# Patient Record
Sex: Male | Born: 1990 | Race: White | Hispanic: No | Marital: Married | Smoking: Former smoker
Health system: Southern US, Community
[De-identification: ages and names within clinical notes are randomized; demographics above are authoritative.]

## PROBLEM LIST (undated history)

## (undated) HISTORY — PX: OTHER SURGICAL HISTORY: SHX169

---
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
## 2015-03-26 ENCOUNTER — Encounter: Payer: Self-pay | Admitting: Emergency Medicine

## 2015-03-26 ENCOUNTER — Ambulatory Visit
Admission: EM | Admit: 2015-03-26 | Discharge: 2015-03-26 | Disposition: A | Discharge status: Home / Self Care | Payer: BC Managed Care – PPO | Attending: Family Medicine | Admitting: Family Medicine

## 2015-03-26 DIAGNOSIS — L247 Irritant contact dermatitis due to plants, except food: Secondary | ICD-10-CM

## 2015-03-26 DIAGNOSIS — R21 Rash and other nonspecific skin eruption: Secondary | ICD-10-CM | POA: Diagnosis not present

## 2015-03-26 MED ORDER — PREDNISONE 10 MG (21) PO TBPK: ORAL_TABLET | ORAL | Status: DC

## 2015-03-26 MED ORDER — RANITIDINE HCL 150 MG PO CAPS: 150.0000 mg | ORAL_CAPSULE | Freq: Two times a day (BID) | ORAL | Status: DC

## 2015-03-26 MED ORDER — METHYLPREDNISOLONE SODIUM SUCC 125 MG IJ SOLR
125.0000 mg | Freq: Once | INTRAMUSCULAR | Status: AC
  Administered 2015-03-26: 125 mg via INTRAMUSCULAR

## 2015-03-26 MED ORDER — LORATADINE 10 MG PO TABS: 10.0000 mg | ORAL_TABLET | Freq: Every day | ORAL | Status: DC

## 2015-03-26 NOTE — ED Provider Notes (Signed)
CSN: [id number]     Arrival date & time 03/26/15  1246 History   First MD Initiated Contact with Patient 03/26/15 1333     Chief Complaint  Patient presents with  . Rash   he states he was exposed to poison ivy or poison sumac on Tuesday feels miserable now. (Consider location/radiation/quality/duration/timing/severity/associated sxs/prior Treatment) Patient is a 24 y.o. male presenting with rash. The history is provided by the patient. No language interpreter was used.  Rash Location:  Shoulder/arm Shoulder/arm rash location:  L upper arm, L arm, R arm, R upper arm, L forearm, R forearm, R wrist and L wrist Quality: blistering, draining, itchiness and swelling   Quality: not dry, not painful and not peeling   Severity:  Moderate Onset quality:  Sudden Duration:  3 days Timing:  Rare Progression:  Worsening Chronicity:  New Context: exposure to similar rash   Relieved by:  Nothing Worsened by:  Continued exposure to allergens Ineffective treatments:  Anti-fungal cream Associated symptoms: no nausea      History reviewed. No pertinent past medical history. Past Surgical History  Procedure Laterality Date  . Arm surgery Right    History reviewed. No pertinent family history. Social History  Substance Use Topics  . Smoking status: Former Games developer  . Smokeless tobacco: None  . Alcohol Use: Yes    patient does not smoke. Was a former smoker nurse's notes were reviewed. Review of Systems  Gastrointestinal: Negative for nausea.  Skin: Positive for rash.  All other systems reviewed and are negative.   Allergies  Review of patient's allergies indicates no known allergies.  Home Medications   Prior to Admission medications   Medication Sig Start Date End Date Taking? Authorizing Siegfried Vieth  loratadine (CLARITIN) 10 MG tablet Take 1 tablet (10 mg total) by mouth daily. For itching 03/26/15   Hassan Rowan, MD  predniSONE (STERAPRED UNI-PAK 21 TAB) 10 MG (21) TBPK tablet 6 tabs  day 1 and 2, 5 tabs day 3 and 4, 4 tabs day 5 and 6, 3 tabs day 7 and 8, 2 tabs day 9 and 10, 1 tab day 11 and 12. Take orally 03/26/15   Hassan Rowan, MD  ranitidine (ZANTAC) 150 MG capsule Take 1 capsule (150 mg total) by mouth 2 (two) times daily. 03/26/15   Hassan Rowan, MD   Meds Ordered and Administered this Visit   Medications  methylPREDNISolone sodium succinate (SOLU-MEDROL) 125 mg/2 mL injection 125 mg (125 mg Intramuscular Given 03/26/15 1356)    BP 106/70 mmHg  Pulse 60  Temp(Src) 96.8 F (36 C) (Tympanic)  Resp 16  Ht  (1.88 m)  Wt 140 lb (63.504 kg)  BMI 17.97 kg/m2  SpO2 100% No data found.   Physical Exam  Constitutional: He is oriented to person, place, and time. He appears well-developed and well-nourished.  HENT:  Head: Normocephalic and atraumatic.  Eyes: Conjunctivae are normal. Pupils are equal, round, and reactive to light.  Musculoskeletal: Normal range of motion.  Neurological: He is alert and oriented to person, place, and time.  Skin: Rash noted. There is erythema.     Significant rash on the arms and hands fingers and some of the neck and lower faceconsistent with a contact dermatitis rash.  Psychiatric: He has a normal mood and affect. His behavior is normal.    ED Course  Procedures (including critical care time)  Labs Review Labs Reviewed - No data to display  Imaging Review No results found.   Visual  Acuity Review  Right Eye Distance:   Left Eye Distance:   Bilateral Distance:    Right Eye Near:   Left Eye Near:    Bilateral Near:         MDM   1. Contact dermatitis and eczema due to plant   2. Rash       Patient insight Medrol taper 125 mg IM will be. Will be placed on Claritin 10 mg daily Zantac 150 twice a day for the itching. Place on 12 day prednisone Dosepak. Patient declined work note for today.     Hassan Rowan, MD 03/26/15 [id number]

## 2015-03-26 NOTE — Discharge Instructions (Signed)
Poison [employer] ivy is a rash caused by touching the leaves of the poison ivy plant. The rash often shows up 48 hours later. You might just have bumps, redness, and itching. Sometimes, blisters appear and break open. Your eyes may get puffy (swollen). Poison ivy often heals in 2 to 3 weeks without treatment. HOME CARE  If you touch poison ivy:  Wash your skin with soap and water right away. Wash under your fingernails. Do not rub the skin very hard.  Wash any clothes you were wearing.  Avoid poison ivy in the future. Poison ivy has 3 leaves on a stem.  Use medicine to help with itching as told by your doctor. Do not drive when you take this medicine.  Keep open sores dry, clean, and covered with a bandage and medicated cream, if needed.  Ask your doctor about medicine for children. GET HELP RIGHT AWAY IF:  You have open sores.  Redness spreads beyond the area of the rash.  There is yellowish white fluid (pus) coming from the rash.  Pain gets worse.  You have a temperature by mouth above 102 F (38.9 C), not controlled by medicine. MAKE SURE YOU:  Understand these instructions.  Will watch your condition.  Will get help right away if you are not doing well or get worse. Document Released: 07/16/2010 Document Revised: 09/05/2011 Document Reviewed: 07/16/2010 [facility] Patient Information 2015 [place], [place]. This information is not intended to replace advice given to you by your health care provider. Make sure you discuss any questions you have with your health care provider.  Contact Dermatitis Contact dermatitis is a rash that happens when something touches the skin. You touched something that irritates your skin, or you have allergies to something you touched. HOME CARE   Avoid the thing that caused your rash.  Keep your rash away from hot water, soap, sunlight, chemicals, and other things that might bother it.  Do not scratch your rash.  You can take cool baths to  help stop itching.  Only take medicine as told by your doctor.  Keep all doctor visits as told. GET HELP RIGHT AWAY IF:   Your rash is not better after 3 days.  Your rash gets worse.  Your rash is puffy (swollen), tender, red, sore, or warm.  You have problems with your medicine. MAKE SURE YOU:   Understand these instructions.  Will watch your condition.  Will get help right away if you are not doing well or get worse. Document Released: 04/10/2009 Document Revised: 09/05/2011 Document Reviewed: 11/16/2010 [facility] Patient Information 2015 [place], [place]. This information is not intended to replace advice given to you by your health care provider. Make sure you discuss any questions you have with your health care provider.  Rash A rash is a change in the color or feel of your skin. There are many different types of rashes. You may have other problems along with your rash. HOME CARE  Avoid the thing that caused your rash.  Do not scratch your rash.  You may take cools baths to help stop itching.  Only take medicines as told by your doctor.  Keep all doctor visits as told. GET HELP RIGHT AWAY IF:   Your pain, puffiness (swelling), or redness gets worse.  You have a fever.  You have new or severe problems.  You have body aches, watery poop (diarrhea), or you throw up (vomit).  Your rash is not better after 3 days. MAKE SURE YOU:  Understand these instructions.  Will watch your condition.  Will get help right away if you are not doing well or get worse. Document Released: 11/30/2007 Document Revised: 09/05/2011 Document Reviewed: 03/28/2011 [employer] Patient Information 2015 [place], [place]. This information is not intended to replace advice given to you by your health care provider. Make sure you discuss any questions you have with your health care provider.

## 2015-03-26 NOTE — ED Notes (Signed)
Patient c/o itchy red rash on his arms and face for the past 2 days.

## 2017-05-16 ENCOUNTER — Encounter: Payer: Self-pay | Admitting: Emergency Medicine

## 2017-05-16 ENCOUNTER — Emergency Department: Payer: Worker's Compensation

## 2017-05-16 ENCOUNTER — Emergency Department
Admission: EM | Admit: 2017-05-16 | Discharge: 2017-05-16 | Disposition: A | Discharge status: Home / Self Care | Payer: Worker's Compensation | Attending: Emergency Medicine | Admitting: Emergency Medicine

## 2017-05-16 DIAGNOSIS — W231XXA Caught, crushed, jammed, or pinched between stationary objects, initial encounter: Secondary | ICD-10-CM | POA: Diagnosis not present

## 2017-05-16 DIAGNOSIS — Y9389 Activity, other specified: Secondary | ICD-10-CM | POA: Diagnosis not present

## 2017-05-16 DIAGNOSIS — S6991XA Unspecified injury of right wrist, hand and finger(s), initial encounter: Secondary | ICD-10-CM | POA: Diagnosis present

## 2017-05-16 DIAGNOSIS — Y929 Unspecified place or not applicable: Secondary | ICD-10-CM | POA: Diagnosis not present

## 2017-05-16 DIAGNOSIS — Y990 Civilian activity done for income or pay: Secondary | ICD-10-CM | POA: Insufficient documentation

## 2017-05-16 DIAGNOSIS — Z79899 Other long term (current) drug therapy: Secondary | ICD-10-CM | POA: Diagnosis not present

## 2017-05-16 DIAGNOSIS — S60211A Contusion of right wrist, initial encounter: Secondary | ICD-10-CM | POA: Diagnosis not present

## 2017-05-16 DIAGNOSIS — Z87891 Personal history of nicotine dependence: Secondary | ICD-10-CM | POA: Insufficient documentation

## 2017-05-16 DIAGNOSIS — S60811A Abrasion of right wrist, initial encounter: Secondary | ICD-10-CM | POA: Diagnosis not present

## 2017-05-16 MED ORDER — BACITRACIN ZINC 500 UNIT/GM EX OINT
TOPICAL_OINTMENT | Freq: Once | CUTANEOUS | Status: AC
  Administered 2017-05-16: 1 via TOPICAL
  Filled 2017-05-16: qty 0.9

## 2017-05-16 MED ORDER — IBUPROFEN 800 MG PO TABS
800.0000 mg | ORAL_TABLET | Freq: Once | ORAL | Status: AC
  Administered 2017-05-16: 800 mg via ORAL
  Filled 2017-05-16: qty 1
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
## 2017-05-16 NOTE — ED Notes (Signed)
Per Supervisor, Marshall & IlsleyHerman Maxey, no urine drug screen required.AS
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
## 2017-05-16 NOTE — Discharge Instructions (Signed)
Your x-rays are negative for fracture. Keep the wounds clean, dry, and covered. Wear the wrist splint as needed for support. Apply ice to reduce pain and swelling. Follow-up with ortho for continued symptoms.
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
## 2018-09-28 IMAGING — CR DG WRIST COMPLETE 3+V*R*
4 series · 4 of 4 positions shown · non-contrast
Comparison: None.

CLINICAL DATA: Heavy object fell on wrist with pain, initial
encounter

EXAM:
RIGHT WRIST - COMPLETE 3+ VIEW

[wrist pa]
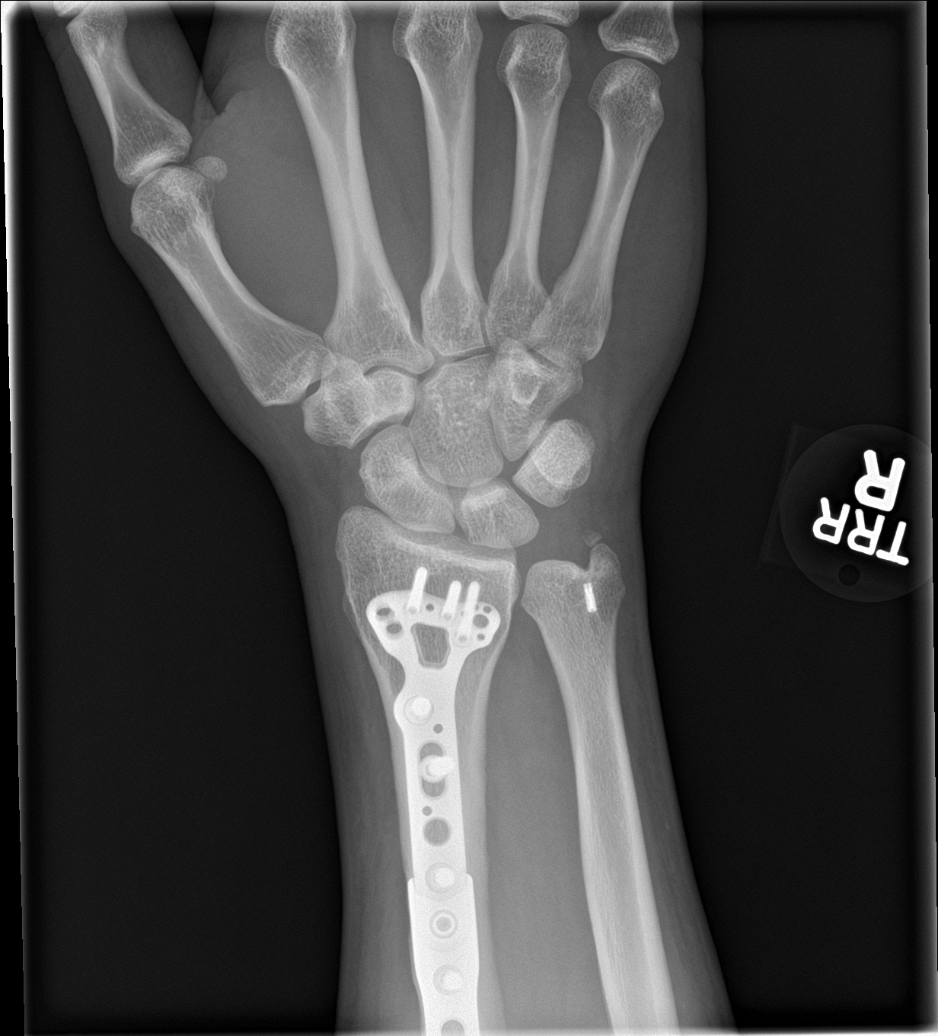

[wrist obl]
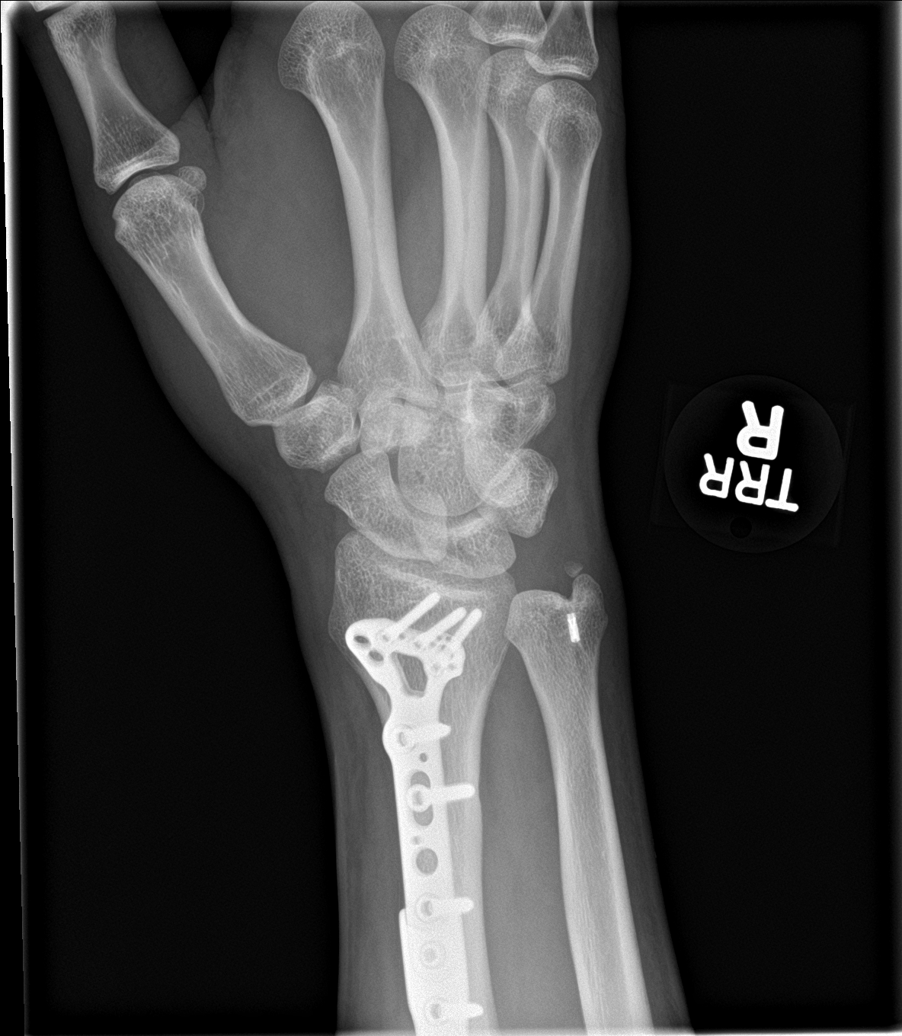

[wrist lat]
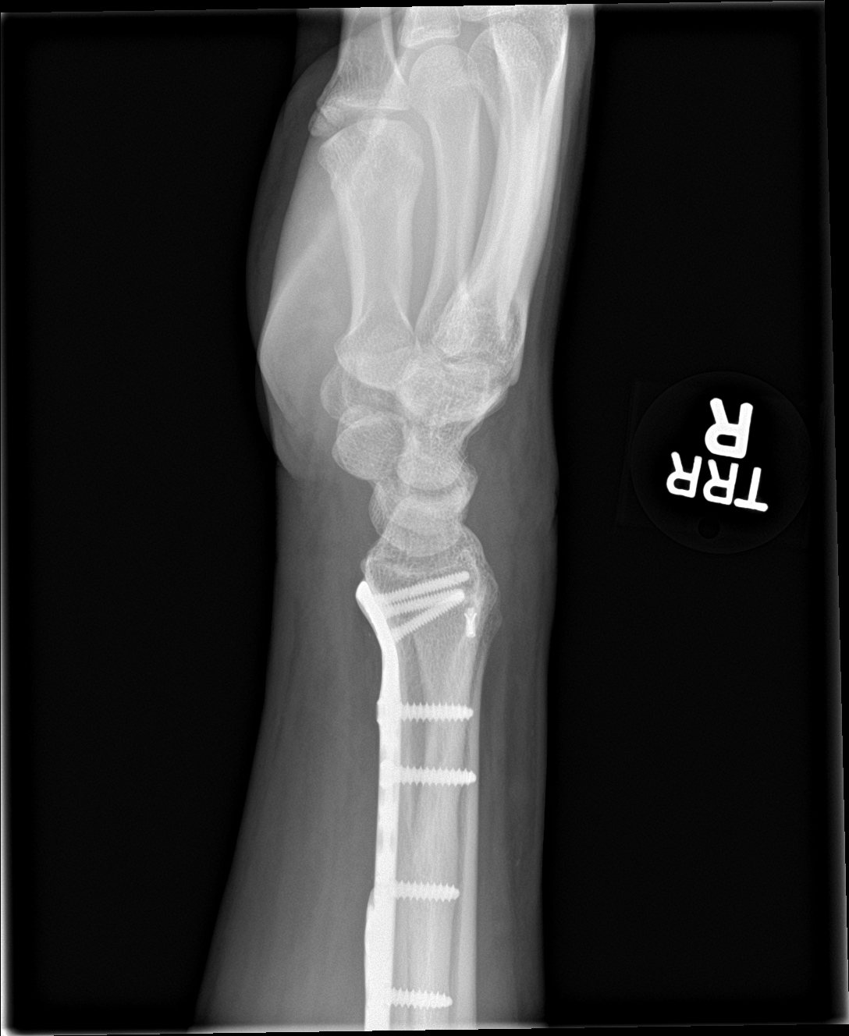

[navicular]
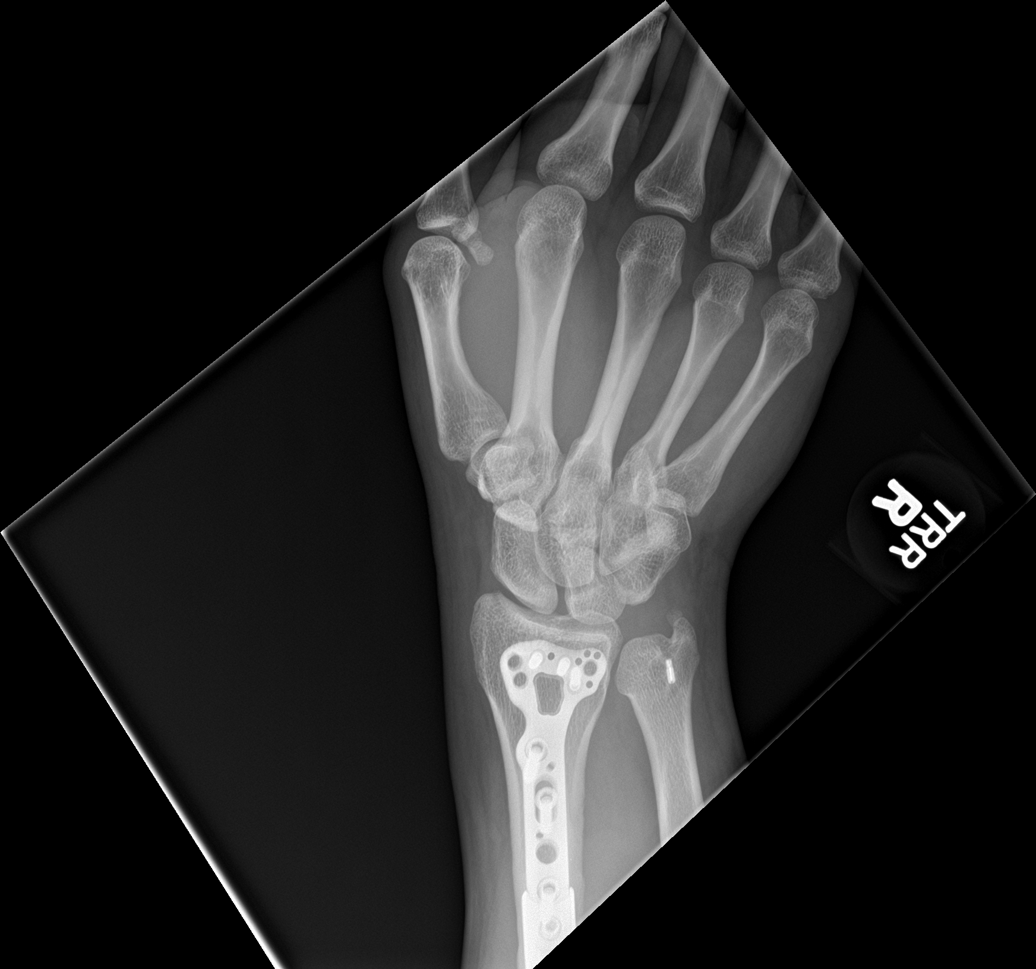

[4 of 4 positions shown; findings below may reference images not displayed]

FINDINGS: Postsurgical changes are noted in the distal radius and distal ulna.
Well corticated ulnar styloid fracture fragment is noted related to
prior trauma. No acute fracture or dislocation is noted. No soft
tissue changes are seen.
IMPRESSION: Postsurgical and posttraumatic changes without acute abnormality.

## 2020-01-08 ENCOUNTER — Encounter: Payer: Self-pay | Admitting: Emergency Medicine

## 2020-01-08 ENCOUNTER — Other Ambulatory Visit: Payer: Self-pay

## 2020-01-08 ENCOUNTER — Ambulatory Visit
Admission: EM | Admit: 2020-01-08 | Discharge: 2020-01-08 | Disposition: A | Discharge status: Home / Self Care | Payer: PRIVATE HEALTH INSURANCE | Attending: Family Medicine | Admitting: Family Medicine

## 2020-01-08 DIAGNOSIS — Z87891 Personal history of nicotine dependence: Secondary | ICD-10-CM | POA: Insufficient documentation

## 2020-01-08 DIAGNOSIS — Z20822 Contact with and (suspected) exposure to covid-19: Secondary | ICD-10-CM | POA: Insufficient documentation

## 2020-01-08 DIAGNOSIS — J069 Acute upper respiratory infection, unspecified: Secondary | ICD-10-CM | POA: Diagnosis present

## 2020-01-08 MED ORDER — HYDROCOD POLST-CPM POLST ER 10-8 MG/5ML PO SUER: 5.0000 mL | Freq: Every evening | ORAL | 0 refills | Status: DC | PRN

## 2020-01-08 NOTE — Discharge Instructions (Signed)
Take medication as prescribed. Rest. Drink plenty of fluids.  ° °Follow up with your primary care physician this week as needed. Return to Urgent care for new or worsening concerns.  ° °

## 2020-01-08 NOTE — ED Provider Notes (Signed)
[facility] ____________________________________________  Time seen: Approximately 6:40 PM  I have reviewed the triage vital signs and the nursing notes.   HISTORY  Chief Complaint Cough and Nasal Congestion   HPI Donald Sharp is a 29 y.o. male present for evaluation of redness, nasal ingestion and coughing.  Reports this past Saturday he started with runny nose and nasal congestion sinus pressure, and reports cough started more yesterday.  States cough is disrupting sleep.  States now feel sore from coughing.  Denies fevers.  Occasional sore throat from coughing alone, denies other sore throat.  Denies changes in taste or smell, vomiting, diarrhea.  Continues eat and drink well.  Denies known sick contacts.  Not previously vaccinated for COVID-19.  Did take over-the-counter cough congestion medication with some improvement, no resolution.  Reports otherwise doing well.   History reviewed. No pertinent past medical history.  There are no problems to display for this patient.   Past Surgical History:  Procedure Laterality Date   arm surgery Right      No current facility-administered medications for this encounter.  Current Outpatient Medications:    chlorpheniramine-HYDROcodone (TUSSIONEX PENNKINETIC ER) 10-8 MG/5ML SUER, Take 5 mLs by mouth at bedtime as needed for cough. do not drive or operate machinery while taking as can cause drowsiness., Disp: 50 mL, Rfl: 0  Allergies Patient has no known allergies.  History reviewed. No pertinent family history.  Social History Social History   Tobacco Use   Smoking status: Former Smoker   Smokeless tobacco: Never Used  Substance Use Topics   Alcohol use: Yes   Drug use: Not on file    Review of Systems Constitutional: No fever ENT: As above.  Cardiovascular: Denies chest pain. Respiratory: Denies shortness of breath. Gastrointestinal: No abdominal pain.  No nausea, no vomiting.  No diarrhea.     Musculoskeletal: Negative for back pain. Skin: Negative for rash.   ____________________________________________   PHYSICAL EXAM:  VITAL SIGNS: ED Triage Vitals  Enc Vitals Group     BP 01/08/20 1715 (!) 166/68     Pulse Rate 01/08/20 1715 91     Resp 01/08/20 1715 18     Temp 01/08/20 1715 98.6 F (37 C)     Temp Source 01/08/20 1715 Oral     SpO2 01/08/20 1715 98 %     Weight 01/08/20 1713 152 lb (68.9 kg)     Height 01/08/20 1713 6\' 1"  (1.854 m)     Head Circumference --      Peak Flow --      Pain Score 01/08/20 1713 0     Pain Loc --      Pain Edu? --      Excl. in GC? --     Constitutional: Alert and oriented. Well appearing and in no acute distress. Eyes: Conjunctivae are normal.  Head: Atraumatic. No sinus tenderness to palpation. No swelling. No erythema.  Ears: no erythema, normal TMs bilaterally.   Nose:Nasal congestion   Mouth/Throat: Mucous membranes are moist. No pharyngeal erythema. No tonsillar swelling or exudate.  Neck: No stridor.  No cervical spine tenderness to palpation. Hematological/Lymphatic/Immunilogical: No cervical lymphadenopathy. Cardiovascular: Normal rate, regular rhythm. Grossly normal heart sounds.  Good peripheral circulation. Respiratory: Normal respiratory effort.  No retractions. No wheezes, rales or rhonchi. Good air movement.  Musculoskeletal: Ambulatory with steady gait. Neurologic:  Normal speech and language. No gait instability. Skin:  Skin appears warm, dry and intact. No rash noted. Psychiatric: Mood and affect  are normal. Speech and behavior are normal.  ___________________________________________   LABS (all labs ordered are listed, but only abnormal results are displayed)  Labs Reviewed  SARS CORONAVIRUS 2 (TAT 6-24 HRS)   ____________________________________________  PROCEDURES Procedures    INITIAL IMPRESSION / ASSESSMENT AND PLAN / ED COURSE  Pertinent labs & imaging results that were available during my  care of the patient were reviewed by me and considered in my medical decision making (see chart for details).  Well-appearing patient.  No acute distress.  Suspect viral upper respiratory infection.  COVID-19 testing ordered.  Encourage rest, fluids, supportive care, over-the-counter cough congestion medication during the day and as needed Tussionex at night.  Work note given.Discussed indication, risks and benefits of medications with patient.   Discussed follow up with Primary care physician this week. Discussed follow up and return parameters including no resolution or any worsening concerns. Patient verbalized understanding and agreed to plan.   ____________________________________________   FINAL CLINICAL IMPRESSION(S) / ED DIAGNOSES  Final diagnoses:  Viral URI with cough     ED Discharge Orders         Ordered    chlorpheniramine-HYDROcodone (TUSSIONEX PENNKINETIC ER) 10-8 MG/5ML SUER  At bedtime PRN     Discontinue  Reprint     01/08/20 1736           Note: This dictation was prepared with Dragon dictation along with smaller phrase technology. Any transcriptional errors that result from this process are unintentional.         Renford Dills, NP 01/08/20 1843

## 2020-01-08 NOTE — ED Triage Notes (Signed)
Patient c/o sinus congestion and drainage that started on Saturday. He states he started having a cough that started yesterday. Denies fever.

## 2020-01-09 LAB — SARS CORONAVIRUS 2 (TAT 6-24 HRS): SARS Coronavirus 2: NEGATIVE

## 2021-07-09 ENCOUNTER — Encounter: Payer: Self-pay | Admitting: Emergency Medicine

## 2021-07-09 ENCOUNTER — Ambulatory Visit
Admission: EM | Admit: 2021-07-09 | Discharge: 2021-07-09 | Disposition: A | Discharge status: Home / Self Care | Payer: BC Managed Care – PPO | Attending: Emergency Medicine | Admitting: Emergency Medicine

## 2021-07-09 ENCOUNTER — Other Ambulatory Visit: Payer: Self-pay

## 2021-07-09 DIAGNOSIS — J0100 Acute maxillary sinusitis, unspecified: Secondary | ICD-10-CM

## 2021-07-09 MED ORDER — AMOXICILLIN-POT CLAVULANATE 875-125 MG PO TABS: 1.0000 | ORAL_TABLET | Freq: Two times a day (BID) | ORAL | 0 refills | Status: AC

## 2021-07-09 MED ORDER — IPRATROPIUM BROMIDE 0.06 % NA SOLN: 2.0000 | Freq: Four times a day (QID) | NASAL | 12 refills | Status: DC

## 2021-07-09 MED ORDER — PROMETHAZINE-DM 6.25-15 MG/5ML PO SYRP: 5.0000 mL | ORAL_SOLUTION | Freq: Four times a day (QID) | ORAL | 0 refills | Status: DC | PRN

## 2021-07-09 NOTE — ED Provider Notes (Signed)
With thick yellow to green nasal discharge.  He is also experiencing postnasal drip [facility]    CSN: [id number] Arrival date & time: 07/09/21  1153      History   Chief Complaint Chief Complaint  Patient presents with   Sinus Problem   Cough    HPI Donald Sharp is a 31 y.o. male.   HPI  31 year old male here for evaluation of respiratory complaints.  Patient reports that he has been experiencing sinus congestion and pressure for the last 2 weeks that is associated with a thick yellow nasal discharge and postnasal drip.  1 week ago he started to develop a cough which is mostly present at night but occasionally throughout the day.  Patient denies any fever, ear pain, sore throat, shortness breath, or wheezing.  History reviewed. No pertinent past medical history.  There are no problems to display for this patient.   Past Surgical History:  Procedure Laterality Date   arm surgery Right        Home Medications    Prior to Admission medications   Medication Sig Start Date End Date Taking? Authorizing Provider  amoxicillin-clavulanate (AUGMENTIN) 875-125 MG tablet Take 1 tablet by mouth every 12 (twelve) hours for 10 days. 07/09/21 07/19/21 Yes Margarette Canada, NP  ipratropium (ATROVENT) 0.06 % nasal spray Place 2 sprays into both nostrils 4 (four) times daily. 07/09/21  Yes Margarette Canada, NP  promethazine-dextromethorphan (PROMETHAZINE-DM) 6.25-15 MG/5ML syrup Take 5 mLs by mouth 4 (four) times daily as needed. 07/09/21  Yes Margarette Canada, NP  loratadine (CLARITIN) 10 MG tablet Take 1 tablet (10 mg total) by mouth daily. For itching 03/26/15 01/08/20  Frederich Cha, MD  ranitidine (ZANTAC) 150 MG capsule Take 1 capsule (150 mg total) by mouth 2 (two) times daily. 03/26/15 01/08/20  Frederich Cha, MD    Family History History reviewed. No pertinent family history.  Social History Social History   Tobacco Use   Smoking status: Former   Smokeless tobacco: Never   Scientific laboratory technician Use: Never used  Substance Use Topics   Alcohol use: Yes   Drug use: Never     Allergies   Patient has no known allergies.   Review of Systems Review of Systems  Constitutional:  Negative for activity change, appetite change and fever.  HENT:  Positive for congestion and sinus pressure. Negative for ear pain and sore throat.   Respiratory:  Positive for cough. Negative for shortness of breath and wheezing.   Gastrointestinal:  Negative for diarrhea, nausea and vomiting.  Skin:  Negative for rash.  Hematological: Negative.   Psychiatric/Behavioral: Negative.      Physical Exam Triage Vital Signs ED Triage Vitals  Enc Vitals Group     BP 07/09/21 1216 109/72     Pulse Rate 07/09/21 1216 77     Resp 07/09/21 1216 15     Temp 07/09/21 1216 98.3 F (36.8 C)     Temp Source 07/09/21 1216 Oral     SpO2 07/09/21 1216 99 %     Weight 07/09/21 1214 162 lb (73.5 kg)     Height 07/09/21 1214 $RemoveBefor'6\' 1"'chImfUqXqYCG$  (1.854 m)     Head Circumference --      Peak Flow --      Pain Score 07/09/21 1213 3     Pain Loc --      Pain Edu? --      Excl. in [place]? --    No data found.  Updated Vital Signs BP 109/72 (BP Location: Left Arm)    Pulse 77    Temp 98.3 F (36.8 C) (Oral)    Resp 15    Ht $R'6\' 1"'fX$  (1.854 m)    Wt 162 lb (73.5 kg)    SpO2 99%    BMI 21.37 kg/m   Visual Acuity Right Eye Distance:   Left Eye Distance:   Bilateral Distance:    Right Eye Near:   Left Eye Near:    Bilateral Near:     Physical Exam Vitals and nursing note reviewed.  Constitutional:      General: He is not in acute distress.    Appearance: Normal appearance. He is not ill-appearing.  HENT:     Head: Normocephalic and atraumatic.     Right Ear: Tympanic membrane, ear canal and external ear normal. There is no impacted cerumen.     Left Ear: Tympanic membrane, ear canal and external ear normal. There is no impacted cerumen.     Nose: Congestion and rhinorrhea present.     Comments:  Bilateral maxillary sinuses are tender to percussion.    Mouth/Throat:     Mouth: Mucous membranes are moist.     Pharynx: Oropharynx is clear. No posterior oropharyngeal erythema.  Cardiovascular:     Rate and Rhythm: Normal rate and regular rhythm.     Pulses: Normal pulses.     Heart sounds: Normal heart sounds. No murmur heard.   No friction rub. No gallop.  Pulmonary:     Effort: Pulmonary effort is normal.     Breath sounds: Normal breath sounds. No wheezing, rhonchi or rales.  Skin:    General: Skin is warm and dry.     Capillary Refill: Capillary refill takes less than 2 seconds.     Findings: No erythema or rash.  Neurological:     General: No focal deficit present.     Mental Status: He is alert and oriented to person, place, and time.  Psychiatric:        Mood and Affect: Mood normal.        Behavior: Behavior normal.        Thought Content: Thought content normal.        Judgment: Judgment normal.     UC Treatments / Results  Labs (all labs ordered are listed, but only abnormal results are displayed) Labs Reviewed - No data to display  EKG   Radiology No results found.  Procedures Procedures (including critical care time)  Medications Ordered in UC Medications - No data to display  Initial Impression / Assessment and Plan / UC Course  I have reviewed the triage vital signs and the nursing notes.  Pertinent labs & imaging results that were available during my care of the patient were reviewed by me and considered in my medical decision making (see chart for details).  Patient is a very pleasant, nontoxic-appearing 31 year old male here for evaluation of 2 weeks worth of sinus pressure with yellow nasal discharge in 1 week worth of cough that is mostly present at night.  Patient denies any fevers and he is afebrile here.  He describes his nasal discharge is a thick yellow.  Physical exam reveals pearly gray tympanic membranes bilaterally with normal light  reflex and clear external auditory canals.  Nasal mucosa is erythematous and edematous.  Bilateral maxillary sinuses are tender to percussion.  Oropharyngeal exam is benign.  No cervical lymphadenopathy appreciated exam.  Cardiopulmonary exam reveals clear lung  sounds in all fields.  Patient exam is consistent with maxillary sinusitis.  We will place patient on Augmentin twice daily for 10 days, Atrovent nasal spray to help with nasal congestion, and Promethazine DM cough syrup use at bedtime since that is predominantly when his cough symptoms arise.  I also discussed sinus irrigation with the patient and given him instructions on how to do ss.   Final Clinical Impressions(s) / UC Diagnoses   Final diagnoses:  Acute non-recurrent maxillary sinusitis     Discharge Instructions      The Augmentin twice daily with food for 10 days for treatment of your sinusitis.  Perform sinus irrigation 2-3 times a day with a NeilMed sinus rinse kit and distilled water.  Do not use tap water.  You can use plain over-the-counter Mucinex every 6 hours to break up the stickiness of the mucus so your body can clear it.  Increase your oral fluid intake to thin out your mucus so that is also able for your body to clear more easily.  Take an over-the-counter probiotic, such as Culturelle-align-activia, 1 hour after each dose of antibiotic to prevent diarrhea.  Use the Atrovent nasal spray, 2 squirts in each nostril every 6 hours, as needed for runny nose and postnasal drip.  Use the Promethazine DM cough syrup at bedtime for cough and congestion.  It will make you drowsy so do not take it during the day.  If you develop any new or worsening symptoms return for reevaluation or see your primary care provider.      ED Prescriptions     Medication Sig Dispense Auth. Provider   amoxicillin-clavulanate (AUGMENTIN) 875-125 MG tablet Take 1 tablet by mouth every 12 (twelve) hours for 10 days. 20 tablet Margarette Canada, NP   ipratropium (ATROVENT) 0.06 % nasal spray Place 2 sprays into both nostrils 4 (four) times daily. 15 mL Margarette Canada, NP   promethazine-dextromethorphan (PROMETHAZINE-DM) 6.25-15 MG/5ML syrup Take 5 mLs by mouth 4 (four) times daily as needed. 118 mL Margarette Canada, NP      PDMP not reviewed this encounter.   Margarette Canada, NP 07/09/21 1257

## 2021-07-09 NOTE — ED Triage Notes (Signed)
Patient c/o sinus congestion and pressure that started 2 weeks ago.  Patient reports bodyaches and cough that started earlier this week.  Patient reports low grade fevers.

## 2021-07-09 NOTE — Discharge Instructions (Signed)
The Augmentin twice daily with food for 10 days for treatment of your sinusitis.  Perform sinus irrigation 2-3 times a day with a NeilMed sinus rinse kit and distilled water.  Do not use tap water.  You can use plain over-the-counter Mucinex every 6 hours to break up the stickiness of the mucus so your body can clear it.  Increase your oral fluid intake to thin out your mucus so that is also able for your body to clear more easily.  Take an over-the-counter probiotic, such as Culturelle-align-activia, 1 hour after each dose of antibiotic to prevent diarrhea.  Use the Atrovent nasal spray, 2 squirts in each nostril every 6 hours, as needed for runny nose and postnasal drip.  Use the Promethazine DM cough syrup at bedtime for cough and congestion.  It will make you drowsy so do not take it during the day.  If you develop any new or worsening symptoms return for reevaluation or see your primary care provider.

## 2022-04-21 DIAGNOSIS — W228XXA Striking against or struck by other objects, initial encounter: Secondary | ICD-10-CM | POA: Diagnosis not present

## 2022-04-21 DIAGNOSIS — S0191XA Laceration without foreign body of unspecified part of head, initial encounter: Secondary | ICD-10-CM | POA: Diagnosis not present

## 2022-04-21 DIAGNOSIS — S0181XA Laceration without foreign body of other part of head, initial encounter: Secondary | ICD-10-CM | POA: Diagnosis not present

## 2022-04-21 DIAGNOSIS — Z23 Encounter for immunization: Secondary | ICD-10-CM | POA: Diagnosis not present

## 2022-08-24 ENCOUNTER — Ambulatory Visit
Admission: RE | Admit: 2022-08-24 | Discharge: 2022-08-24 | Disposition: A | Discharge status: Home / Self Care | Payer: BC Managed Care – PPO | Source: Ambulatory Visit | Attending: Emergency Medicine | Admitting: Emergency Medicine

## 2022-08-24 VITALS — BP 109/72 | HR 81 | Temp 98.2°F | Resp 18

## 2022-08-24 DIAGNOSIS — J0101 Acute recurrent maxillary sinusitis: Secondary | ICD-10-CM

## 2022-08-24 MED ORDER — AMOXICILLIN 875 MG PO TABS: 875.0000 mg | ORAL_TABLET | Freq: Two times a day (BID) | ORAL | 0 refills | Status: AC

## 2022-08-24 NOTE — ED Notes (Signed)
Patient established with pcp/ new patient appointment made for May 15th at 0820am w/ Rosette Reveal at [place] at [facility].

## 2022-08-24 NOTE — ED Triage Notes (Signed)
Patient to Urgent Care with complaints of nasal congestion/ productive cough w/ yellow mucus/ and generalized body aches. Denies any known fevers. Does report some cold chills last night.  Symptoms started five days ago. Reports same symptoms one month ago.

## 2022-08-24 NOTE — Discharge Instructions (Addendum)
Take the amoxicillin as directed.  Follow up with your primary care provider if your symptoms are not improving.   ° ° °

## 2022-08-24 NOTE — ED Provider Notes (Signed)
Roderic Palau    CSN: [id number] Arrival date & time: 08/24/22  [id number]      History   Chief Complaint Chief Complaint  Patient presents with   Nasal Congestion    Congesting in my chest, cough,body ache - Entered by patient    HPI Donald Sharp is a 32 y.o. male.  Presents with 1 month history of nasal congestion.  His nasal mucus became yellow 5 days ago.  He reports chills, body aches, sore throat, productive cough x 1 day.  Treating with Mucinex.  He denies fever, rash, ear pain, shortness of breath, chest pain, abdominal pain, vomiting, diarrhea, or other symptoms.   No pertinent medical history.    The history is provided by the patient and medical records.    History reviewed. No pertinent past medical history.  There are no problems to display for this patient.   Past Surgical History:  Procedure Laterality Date   arm surgery Right        Home Medications    Prior to Admission medications   Medication Sig Start Date End Date Taking? Authorizing Provider  amoxicillin (AMOXIL) 875 MG tablet Take 1 tablet (875 mg total) by mouth 2 (two) times daily for 10 days. 08/24/22 09/03/22 Yes Sharion Balloon, NP  ipratropium (ATROVENT) 0.06 % nasal spray Place 2 sprays into both nostrils 4 (four) times daily. 07/09/21   Margarette Canada, NP  promethazine-dextromethorphan (PROMETHAZINE-DM) 6.25-15 MG/5ML syrup Take 5 mLs by mouth 4 (four) times daily as needed. Patient not taking: Reported on 08/24/2022 07/09/21   Margarette Canada, NP  loratadine (CLARITIN) 10 MG tablet Take 1 tablet (10 mg total) by mouth daily. For itching 03/26/15 01/08/20  Frederich Cha, MD  ranitidine (ZANTAC) 150 MG capsule Take 1 capsule (150 mg total) by mouth 2 (two) times daily. 03/26/15 01/08/20  Frederich Cha, MD    Family History History reviewed. No pertinent family history.  Social History Social History   Tobacco Use   Smoking status: Former   Smokeless tobacco: Never  Scientific laboratory technician Use:  Never used  Substance Use Topics   Alcohol use: Yes   Drug use: Never     Allergies   Patient has no known allergies.   Review of Systems Review of Systems  Constitutional:  Positive for chills. Negative for fever.  HENT:  Positive for congestion, postnasal drip, rhinorrhea and sore throat. Negative for ear pain.   Respiratory:  Positive for cough. Negative for shortness of breath.   Cardiovascular:  Negative for chest pain and palpitations.  Gastrointestinal:  Negative for abdominal pain, diarrhea and vomiting.  Skin:  Negative for color change and rash.  All other systems reviewed and are negative.    Physical Exam Triage Vital Signs ED Triage Vitals  Enc Vitals Group     BP      Pulse      Resp      Temp      Temp src      SpO2      Weight      Height      Head Circumference      Peak Flow      Pain Score      Pain Loc      Pain Edu?      Excl. in [place]?    No data found.  Updated Vital Signs BP 109/72   Pulse 81   Temp 98.2 F (36.8 C)   Resp  18   SpO2 97%   Visual Acuity Right Eye Distance:   Left Eye Distance:   Bilateral Distance:    Right Eye Near:   Left Eye Near:    Bilateral Near:     Physical Exam Vitals and nursing note reviewed.  Constitutional:      General: He is not in acute distress.    Appearance: Normal appearance. He is well-developed. He is not ill-appearing.  HENT:     Right Ear: Tympanic membrane normal.     Left Ear: Tympanic membrane normal.     Nose: Congestion present.     Mouth/Throat:     Mouth: Mucous membranes are moist.     Pharynx: Oropharynx is clear.  Cardiovascular:     Rate and Rhythm: Normal rate and regular rhythm.     Heart sounds: Normal heart sounds.  Pulmonary:     Effort: Pulmonary effort is normal. No respiratory distress.     Breath sounds: Normal breath sounds.  Musculoskeletal:     Cervical back: Neck supple.  Skin:    General: Skin is warm and dry.  Neurological:     Mental Status: He is  alert.  Psychiatric:        Mood and Affect: Mood normal.        Behavior: Behavior normal.      UC Treatments / Results  Labs (all labs ordered are listed, but only abnormal results are displayed) Labs Reviewed - No data to display  EKG   Radiology No results found.  Procedures Procedures (including critical care time)  Medications Ordered in UC Medications - No data to display  Initial Impression / Assessment and Plan / UC Course  I have reviewed the triage vital signs and the nursing notes.  Pertinent labs & imaging results that were available during my care of the patient were reviewed by me and considered in my medical decision making (see chart for details).    Acute recurrent sinusitis.  Treating with amoxicillin.  Education provided on sinus infection.  Discussed symptomatic treatment including Tylenol or ibuprofen, Mucinex, Flonase.  Instructed patient to follow up with his PCP if symptoms are not improving.  Our nurse scheduled patient to establish a PCP.  He agrees to plan of care.   Final Clinical Impressions(s) / UC Diagnoses   Final diagnoses:  Acute recurrent maxillary sinusitis     Discharge Instructions      Take the amoxicillin as directed.  Follow up with your primary care provider if your symptoms are not improving.        ED Prescriptions     Medication Sig Dispense Auth. Provider   amoxicillin (AMOXIL) 875 MG tablet Take 1 tablet (875 mg total) by mouth 2 (two) times daily for 10 days. 20 tablet Sharion Balloon, NP      PDMP not reviewed this encounter.   Sharion Balloon, NP 08/24/22 [phone]

## 2022-11-09 ENCOUNTER — Encounter: Payer: Self-pay | Admitting: Family Medicine

## 2022-11-09 ENCOUNTER — Ambulatory Visit: Payer: BC Managed Care – PPO | Admitting: Family Medicine

## 2022-11-09 VITALS — BP 100/70 | HR 70 | Ht 73.0 in | Wt 171.0 lb

## 2022-11-09 DIAGNOSIS — Z13220 Encounter for screening for lipoid disorders: Secondary | ICD-10-CM

## 2022-11-09 DIAGNOSIS — Z114 Encounter for screening for human immunodeficiency virus [HIV]: Secondary | ICD-10-CM | POA: Diagnosis not present

## 2022-11-09 DIAGNOSIS — Z0000 Encounter for general adult medical examination without abnormal findings: Secondary | ICD-10-CM

## 2022-11-09 DIAGNOSIS — E559 Vitamin D deficiency, unspecified: Secondary | ICD-10-CM

## 2022-11-09 DIAGNOSIS — Z1159 Encounter for screening for other viral diseases: Secondary | ICD-10-CM

## 2022-11-09 DIAGNOSIS — R6882 Decreased libido: Secondary | ICD-10-CM | POA: Diagnosis not present

## 2022-11-09 NOTE — Patient Instructions (Addendum)
-   Obtain fasting labs with orders provided (can have water or black coffee but otherwise no food or drink x 8 hours before labs) - Review information provided - Attend eye doctor annually, dentist every 6 months, work towards or maintain 30 minutes of moderate intensity physical activity at least 5 days per week, and consume a balanced diet - Return in 3 months - Contact us for any questions between now and then  Additionally: Exercise regularly, as directed by your health care provider. Work with your health care provider to lose weight, if needed. Do not use any products that contain nicotine or tobacco. These products include cigarettes, chewing tobacco, and vaping devices, such as e-cigarettes. If you need help quitting, ask your health care provider. Keep all follow-up visits. This is important.

## 2022-11-09 NOTE — Assessment & Plan Note (Signed)
Annual examination completed, risk stratification labs ordered, anticipatory guidance provided.  We will follow labs once resulted. 

## 2022-11-09 NOTE — Assessment & Plan Note (Signed)
Chronic condition, ongoing symptomatology, specifically describes component of low drive, prolonged refractory period, early/premature orgasm.  We discussed the multifactorial etiologies of these conditions, plan as follows: - Nonpharmacologic management with lifestyle modifications focusing on stress reduction, increasing physical activity - Obtain risk stratification labs - 7-month follow-up - Suboptimal progress can be addressed with consideration of SSRI, PDE 5 inhibitor

## 2022-11-09 NOTE — Progress Notes (Signed)
Annual Physical Exam Visit  Patient Information:  Patient ID: Donald Sharp, male DOB: 1990/08/22 Age: 32 y.o. MRN: [id number]   Subjective:   CC: Annual Physical Exam  HPI:  Donald Sharp is here for their annual physical.  I reviewed the past medical history, family history, social history, surgical history, and allergies today and changes were made as necessary.  Please see the problem list section below for additional details.  Past Medical History: History reviewed. No pertinent past medical history. Past Surgical History: Past Surgical History:  Procedure Laterality Date   arm surgery Right 2010   Family History: Family History  Problem Relation Age of Onset   COPD Father    Allergies: No Known Allergies Health Maintenance: Health Maintenance  Topic Date Due   HIV Screening  Never done   Hepatitis C Screening  Never done   INFLUENZA VACCINE  01/26/2023   DTaP/Tdap/Td (2 - Td or Tdap) 04/21/2032   HPV VACCINES  Aged Out   COVID-19 Vaccine  Discontinued    HM Colonoscopy     This patient has no relevant Health Maintenance data.      Medications: Current Outpatient Medications on File Prior to Visit  Medication Sig Dispense Refill   [DISCONTINUED] loratadine (CLARITIN) 10 MG tablet Take 1 tablet (10 mg total) by mouth daily. For itching 15 tablet 0   [DISCONTINUED] ranitidine (ZANTAC) 150 MG capsule Take 1 capsule (150 mg total) by mouth 2 (two) times daily. 60 capsule 0   No current facility-administered medications on file prior to visit.    Review of Systems: No headache, visual changes, nausea, vomiting, diarrhea, constipation, dizziness, abdominal pain, skin rash, fevers, chills, night sweats, swollen lymph nodes, weight loss, chest pain, body aches, joint swelling, muscle aches, shortness of breath, mood changes, visual or auditory hallucinations reported.  Objective:   Vitals:   11/09/22 0822  BP: 100/70  Pulse: 70  SpO2: 98%   Vitals:    11/09/22 0822  Weight: 171 lb (77.6 kg)  Height: 6\' 1"  (1.854 m)   Body mass index is 22.56 kg/m.  General: Well Developed, well nourished, and in no acute distress.  Neuro: Alert and oriented x3, extra-ocular muscles intact, sensation grossly intact. Cranial nerves II through XII are grossly intact, motor, sensory, and coordinative functions are intact. HEENT: Normocephalic, atraumatic, pupils equal round reactive to light, neck supple, no masses, no lymphadenopathy, thyroid nonpalpable. Oropharynx, nasopharynx, external ear canals are unremarkable. Skin: Warm and dry, no rashes noted.  Cardiac: Regular rate and rhythm, no murmurs rubs or gallops. No peripheral edema. Pulses symmetric. Respiratory: Clear to auscultation bilaterally. Not using accessory muscles, speaking in full sentences.  Abdominal: Soft, nontender, nondistended, positive bowel sounds, no masses, no organomegaly. Musculoskeletal: Shoulder, elbow, wrist, hip, knee, ankle stable, and with full range of motion.   Impression and Recommendations:   The patient was counselled, risk factors were discussed, and anticipatory guidance given.  Problem List Items Addressed This Visit       Other   Healthcare maintenance - Primary    Annual examination completed, risk stratification labs ordered, anticipatory guidance provided.  We will follow labs once resulted.      Relevant Orders   Apo A1 + B + Ratio   CBC   Comprehensive metabolic panel   Hepatitis C antibody   HIV Antibody (routine testing w rflx)   Lipid panel   TSH   VITAMIN D 25 Hydroxy (Vit-D Deficiency, Fractures)   Testosterone,Free and  Total   Low libido    Chronic condition, ongoing symptomatology, specifically describes component of low drive, prolonged refractory period, early/premature orgasm.  We discussed the multifactorial etiologies of these conditions, plan as follows: - Nonpharmacologic management with lifestyle modifications focusing on stress  reduction, increasing physical activity - Obtain risk stratification labs - 42-month follow-up - Suboptimal progress can be addressed with consideration of SSRI, PDE 5 inhibitor      Relevant Orders   CBC   Comprehensive metabolic panel   Lipid panel   TSH   Testosterone,Free and Total   Other Visit Diagnoses     Annual physical exam       Relevant Orders   Apo A1 + B + Ratio   CBC   Comprehensive metabolic panel   Hepatitis C antibody   HIV Antibody (routine testing w rflx)   Lipid panel   TSH   VITAMIN D 25 Hydroxy (Vit-D Deficiency, Fractures)   Testosterone,Free and Total   Screening for HIV (human immunodeficiency virus)       Relevant Orders   HIV Antibody (routine testing w rflx)   Need for hepatitis C screening test       Relevant Orders   Hepatitis C antibody   Screening for lipoid disorders       Relevant Orders   Apo A1 + B + Ratio   Comprehensive metabolic panel   Lipid panel   Vitamin D deficiency       Relevant Orders   VITAMIN D 25 Hydroxy (Vit-D Deficiency, Fractures)        Orders & Medications Medications: No orders of the defined types were placed in this encounter.  Orders Placed This Encounter  Procedures   Apo A1 + B + Ratio   CBC   Comprehensive metabolic panel   Hepatitis C antibody   HIV Antibody (routine testing w rflx)   Lipid panel   TSH   VITAMIN D 25 Hydroxy (Vit-D Deficiency, Fractures)   Testosterone,Free and Total     Return in about 3 months (around 02/09/2023) for follow-up symptoms.    [name], MD, [facility]   Primary Care Sports Medicine Primary Care and Sports Medicine at [facility]

## 2022-11-14 LAB — VITAMIN D 25 HYDROXY (VIT D DEFICIENCY, FRACTURES): Vit D, 25-Hydroxy: 47.8 ng/mL (ref 30.0–100.0)

## 2022-11-14 LAB — LIPID PANEL
Chol/HDL Ratio: 3.7 ratio (ref 0.0–5.0)
Cholesterol, Total: 148 mg/dL (ref 100–199)
HDL: 40 mg/dL (ref >39)
LDL Chol Calc (NIH): 93 mg/dL (ref 0–99)
Triglycerides: 78 mg/dL (ref 0–149)
VLDL Cholesterol Cal: 15 mg/dL (ref 5–40)

## 2022-11-14 LAB — COMPREHENSIVE METABOLIC PANEL
ALT: 25 IU/L (ref 0–44)
AST: 21 IU/L (ref 0–40)
Albumin/Globulin Ratio: 1.4 (ref 1.2–2.2)
Albumin: 4.5 g/dL (ref 4.1–5.1)
Alkaline Phosphatase: 59 IU/L (ref 44–121)
BUN/Creatinine Ratio: 15 (ref 9–20)
BUN: 14 mg/dL (ref 6–20)
Bilirubin Total: 0.8 mg/dL (ref 0.0–1.2)
CO2: 26 mmol/L (ref 20–29)
Calcium: 9.5 mg/dL (ref 8.7–10.2)
Chloride: 101 mmol/L (ref 96–106)
Creatinine, Ser: 0.93 mg/dL (ref 0.76–1.27)
Globulin, Total: 3.2 g/dL (ref 1.5–4.5)
Glucose: 92 mg/dL (ref 70–99)
Potassium: 4.4 mmol/L (ref 3.5–5.2)
Sodium: 138 mmol/L (ref 134–144)
Total Protein: 7.7 g/dL (ref 6.0–8.5)
eGFR: 113 mL/min/{1.73_m2} (ref >59)

## 2022-11-14 LAB — APO A1 + B + RATIO
Apolipo. B/A-1 Ratio: 0.7 ratio (ref 0.0–0.7)
Apolipoprotein A-1: 113 mg/dL (ref 101–178)
Apolipoprotein B: 79 mg/dL (ref <90)

## 2022-11-14 LAB — CBC
Hematocrit: 48.7 % (ref 37.5–51.0)
Hemoglobin: 16.8 g/dL (ref 13.0–17.7)
MCH: 30.2 pg (ref 26.6–33.0)
MCHC: 34.5 g/dL (ref 31.5–35.7)
MCV: 87 fL (ref 79–97)
Platelets: 197 10*3/uL (ref 150–450)
RBC: 5.57 x10E6/uL (ref 4.14–5.80)
RDW: 13.1 % (ref 11.6–15.4)
WBC: 5.3 10*3/uL (ref 3.4–10.8)

## 2022-11-14 LAB — HIV ANTIBODY (ROUTINE TESTING W REFLEX): HIV Screen 4th Generation wRfx: NONREACTIVE

## 2022-11-14 LAB — TESTOSTERONE,FREE AND TOTAL
Testosterone, Free: 22.4 pg/mL (ref 8.7–25.1)
Testosterone: 807 ng/dL (ref 264–916)

## 2022-11-14 LAB — HEPATITIS C ANTIBODY: Hep C Virus Ab: NONREACTIVE

## 2022-11-14 LAB — TSH: TSH: 0.612 u[IU]/mL (ref 0.450–4.500)

## 2023-02-10 ENCOUNTER — Ambulatory Visit: Payer: BC Managed Care – PPO | Admitting: Family Medicine

## 2023-02-10 ENCOUNTER — Encounter: Payer: Self-pay | Admitting: Family Medicine

## 2023-02-10 VITALS — BP 136/80 | HR 78 | Ht 73.0 in | Wt 174.0 lb

## 2023-02-10 DIAGNOSIS — R6882 Decreased libido: Secondary | ICD-10-CM

## 2023-02-10 MED ORDER — SILDENAFIL CITRATE 100 MG PO TABS: 50.0000 mg | ORAL_TABLET | ORAL | 0 refills | Status: DC | PRN

## 2023-02-10 NOTE — Patient Instructions (Signed)
-   Focus on stress reduction, specifically with targeting 30 minutes moderate intensity physical activity 5 days of the week - Consideration of mindfulness - Consideration of behavioral therapy (individual versus couples) - Trial sildenafil (Viagra) 1/2-1 tablet 30 minutes to an hour prior to intercourse as needed - Review information attached - Contact us for any question/concerns and follow-up as needed for this issue

## 2023-02-10 NOTE — Assessment & Plan Note (Addendum)
Chronic, ongoing symptomatology.  Describes issues in both with libido and erectile phase.  Fortunately he had completely benign lab workup and normal testosterone levels.  He has taken a new position with a more stable schedule and less likelihood for attending to call.  We extensively reviewed the multifactorial nature of decreased libido and erectile issues.  Plan: - Focus on stress reduction, specifically with targeting 30 minutes moderate intensity physical activity 5 days of the week - Consideration of mindfulness - Consideration of behavioral therapy (individual versus couples) - Trial sildenafil (Viagra) 1/2-1 tablet 30 minutes to an hour prior to intercourse as needed - Review information attached - Contact us for any question/concerns and follow-up as needed for this issue - Persistent issues can be addressed with consideration of urology and/or behavioral therapy referral

## 2023-02-10 NOTE — Progress Notes (Signed)
     Primary Care / Sports Medicine Office Visit  Patient Information:  Patient ID: Gray Affleck, male DOB: 09-24-1990 Age: 32 y.o. MRN: [id number]   Moo Zastoupil is a pleasant 32 y.o. male presenting with the following:  Chief Complaint  Patient presents with   libido    Vitals:   02/10/23 0812  BP: 136/80  Pulse: 78  SpO2: 99%   Vitals:   02/10/23 0812  Weight: 174 lb (78.9 kg)  Height: 6\' 1"  (1.854 m)   Body mass index is 22.96 kg/m.  No results found.   Independent interpretation of notes and tests performed by another provider:   None  Procedures performed:   None  Pertinent History, Exam, Impression, and Recommendations:   Yeiren was seen today for libido.  Low libido Assessment & Plan: Chronic, ongoing symptomatology.  Describes issues in both with libido and erectile phase.  Fortunately he had completely benign lab workup and normal testosterone levels.  He has taken a new position with a more stable schedule and less likelihood for attending to call.  We extensively reviewed the multifactorial nature of decreased libido and erectile issues.  Plan: - Focus on stress reduction, specifically with targeting 30 minutes moderate intensity physical activity 5 days of the week - Consideration of mindfulness - Consideration of behavioral therapy (individual versus couples) - Trial sildenafil (Viagra) 1/2-1 tablet 30 minutes to an hour prior to intercourse as needed - Review information attached - Contact us for any question/concerns and follow-up as needed for this issue - Persistent issues can be addressed with consideration of urology and/or behavioral therapy referral  Orders: -     Sildenafil Citrate; Take 0.5-1 tablets (50-100 mg total) by mouth as needed for erectile dysfunction (for use prior to sexual activity). Take approximately 1 hour before sexual activity  Dispense: 30 tablet; Refill: 0     Orders & Medications Meds ordered this encounter   Medications   sildenafil (VIAGRA) 100 MG tablet    Sig: Take 0.5-1 tablets (50-100 mg total) by mouth as needed for erectile dysfunction (for use prior to sexual activity). Take approximately 1 hour before sexual activity    Dispense:  30 tablet    Refill:  0   No orders of the defined types were placed in this encounter.    No follow-ups on file.     [name], MD, [facility]   Primary Care Sports Medicine Primary Care and Sports Medicine at [facility]

## 2023-02-23 ENCOUNTER — Other Ambulatory Visit: Payer: Self-pay | Admitting: Family Medicine

## 2023-02-23 DIAGNOSIS — R6882 Decreased libido: Secondary | ICD-10-CM

## 2023-12-07 DIAGNOSIS — A499 Bacterial infection, unspecified: Secondary | ICD-10-CM | POA: Diagnosis not present

## 2024-02-12 ENCOUNTER — Encounter: Payer: Self-pay | Admitting: Family Medicine

## 2024-02-12 ENCOUNTER — Ambulatory Visit (INDEPENDENT_AMBULATORY_CARE_PROVIDER_SITE_OTHER): Payer: Self-pay | Admitting: Family Medicine

## 2024-02-12 VITALS — BP 112/68 | HR 78 | Ht 73.0 in | Wt 174.2 lb

## 2024-02-12 DIAGNOSIS — R6882 Decreased libido: Secondary | ICD-10-CM | POA: Diagnosis not present

## 2024-02-12 DIAGNOSIS — Z0000 Encounter for general adult medical examination without abnormal findings: Secondary | ICD-10-CM | POA: Diagnosis not present

## 2024-02-12 MED ORDER — SILDENAFIL CITRATE 100 MG PO TABS: 50.0000 mg | ORAL_TABLET | ORAL | 3 refills | Status: AC | PRN

## 2024-02-12 NOTE — Assessment & Plan Note (Signed)
 Sexual dysfunction - Low libido in the past, previously managed with sildenafil  (Viagra ) with good effect - Ran out of sildenafil  a few weeks ago - No issues with insurance coverage for sildenafil   Low Libido - resolved Erectile dysfunction well-managed with sildenafil . Symptoms improved. High testosterone  levels when checked. Discussed stress impact. - Send sildenafil  refill to [facility] with multiple refills. - Advise to contact if treatment becomes ineffective or new symptoms arise.

## 2024-02-12 NOTE — Patient Instructions (Addendum)
-   Obtain fasting labs with orders provided (can have water or black coffee but otherwise no food or drink x 8 hours before labs) - Review information provided - Attend eye doctor annually, dentist every 6 months, work towards or maintain 30 minutes of moderate intensity physical activity at least 5 days per week, and consume a balanced diet - Return in 1 year for physical - Contact us  for any questions between now and then   VISIT SUMMARY:  Today, you had your annual physical exam. We discussed your libido / ED and general health status, and you received a refill for your medication.  YOUR PLAN:  ED: Your symptoms have been well-managed with sildenafil , and your symptoms have improved. -A refill for sildenafil  has been sent to [facility] with multiple refills. -Please contact us  if the treatment becomes ineffective or if you experience any new symptoms.  ADULT WELLNESS VISIT: This was your annual wellness visit, and there are no new health concerns. -We have ordered labs for cholesterol, blood sugar, hemoglobin A1c, metabolic panel, and blood count. -You have been provided with lab papers for [facility]. -Please schedule a follow-up appointment in one year.

## 2024-02-12 NOTE — Progress Notes (Signed)
 Annual Physical Exam Visit  Patient Information:  Patient ID: Donald Sharp, male DOB: 1990-09-09 Age: 33 y.o. MRN: [id number]   Subjective:   CC: Annual Physical Exam  HPI:  Donald Sharp is here for their annual physical.  I reviewed the past medical history, family history, social history, surgical history, and allergies today and changes were made as necessary.  Please see the problem list section below for additional details.  Past Medical History: History reviewed. No pertinent past medical history. Past Surgical History: Past Surgical History:  Procedure Laterality Date   arm surgery Right 2010   Family History: Family History  Problem Relation Age of Onset   COPD Father    Allergies: No Known Allergies Health Maintenance: Health Maintenance  Topic Date Due   INFLUENZA VACCINE  09/24/2024 (Originally 01/26/2024)   Hepatitis B Vaccines 19-59 Average Risk (1 of 3 - 19+ 3-dose series) 02/11/2025 (Originally 11/29/2009)   HPV VACCINES (1 - 3-dose SCDM series) 02/11/2025 (Originally 11/29/2017)   DTaP/Tdap/Td (2 - Td or Tdap) 04/21/2032   Hepatitis C Screening  Completed   HIV Screening  Completed   Pneumococcal Vaccine  Aged Out   Meningococcal B Vaccine  Aged Out   COVID-19 Vaccine  Discontinued    HM Colonoscopy   This patient has no relevant Health Maintenance data.    Medications: Current Outpatient Medications on File Prior to Visit  Medication Sig Dispense Refill   [DISCONTINUED] loratadine  (CLARITIN ) 10 MG tablet Take 1 tablet (10 mg total) by mouth daily. For itching 15 tablet 0   [DISCONTINUED] ranitidine  (ZANTAC ) 150 MG capsule Take 1 capsule (150 mg total) by mouth 2 (two) times daily. 60 capsule 0   No current facility-administered medications on file prior to visit.    Objective:   Vitals:   02/12/24 0754  BP: 112/68  Pulse: 78  SpO2: 98%   Vitals:   02/12/24 0754  Weight: 174 lb 3.2 oz (79 kg)  Height: 6' 1 (1.854 m)   Body mass  index is 22.98 kg/m.  General: Well Developed, well nourished, and in no acute distress.  Neuro: Alert and oriented x3, extra-ocular muscles intact, sensation grossly intact. Cranial nerves II through XII are grossly intact, motor, sensory, and coordinative functions are intact. HEENT: Normocephalic, atraumatic, neck supple, no masses, no lymphadenopathy, thyroid  nonenlarged. Oropharynx, nasopharynx, external ear canals are unremarkable. Skin: Warm and dry, no rashes noted.  Cardiac: Regular rate and rhythm, no murmurs rubs or gallops. No peripheral edema. Pulses symmetric. Respiratory: Clear to auscultation bilaterally. Speaking in full sentences.  Abdominal: Soft, nontender, nondistended, positive bowel sounds, no masses, no organomegaly. Musculoskeletal: Stable, and with full range of motion.  Impression and Recommendations:   The patient was counselled, risk factors were discussed, and anticipatory guidance given.  Problem List Items Addressed This Visit     Healthcare maintenance - Primary   Annual examination completed, risk stratification labs ordered, anticipatory guidance provided.  We will follow labs once resulted.      Relevant Orders   CBC   Comprehensive metabolic panel with GFR   Hemoglobin A1c   Lipid panel   Low libido (Chronic)   Sexual dysfunction - Low libido in the past, previously managed with sildenafil  (Viagra ) with good effect - Ran out of sildenafil  a few weeks ago - No issues with insurance coverage for sildenafil   Low Libido - resolved Erectile dysfunction well-managed with sildenafil . Symptoms improved. High testosterone  levels when checked. Discussed stress impact. - Send  [facility]. - Advise to contact if treatment becomes ineffective or new symptoms arise.      Relevant Medications   sildenafil  (VIAGRA ) 100 MG tablet     Orders & Medications Medications:  Meds ordered this encounter  Medications    sildenafil  (VIAGRA ) 100 MG tablet    Sig: Take 0.5-1 tablets (50-100 mg total) by mouth as needed for erectile dysfunction (for use prior to sexual activity). Take approximately 1 hour before sexual activity    Dispense:  30 tablet    Refill:  3   Orders Placed This Encounter  Procedures   CBC   Comprehensive metabolic panel with GFR   Hemoglobin A1c   Lipid panel     Return in about 1 year (around 02/11/2025) for CPE.    Selinda JINNY Ku, MD, [facility]   Primary Care Sports Medicine Primary Care and Sports Medicine at [facility]

## 2024-02-12 NOTE — Assessment & Plan Note (Signed)
 Annual examination completed, risk stratification labs ordered, anticipatory guidance provided.  We will follow labs once resulted.

## 2024-02-13 LAB — CBC
Hematocrit: 47.7 % (ref 37.5–51.0)
Hemoglobin: 16.1 g/dL (ref 13.0–17.7)
MCH: 30.1 pg (ref 26.6–33.0)
MCHC: 33.8 g/dL (ref 31.5–35.7)
MCV: 89 fL (ref 79–97)
Platelets: 221 x10E3/uL (ref 150–450)
RBC: 5.35 x10E6/uL (ref 4.14–5.80)
RDW: 12.7 % (ref 11.6–15.4)
WBC: 5.4 x10E3/uL (ref 3.4–10.8)

## 2024-02-13 LAB — COMPREHENSIVE METABOLIC PANEL WITH GFR
ALT: 16 IU/L (ref 0–44)
AST: 20 IU/L (ref 0–40)
Albumin: 4.4 g/dL (ref 4.1–5.1)
Alkaline Phosphatase: 53 IU/L (ref 44–121)
BUN/Creatinine Ratio: 13 (ref 9–20)
BUN: 13 mg/dL (ref 6–20)
Bilirubin Total: 0.9 mg/dL (ref 0.0–1.2)
CO2: 24 mmol/L (ref 20–29)
Calcium: 9.3 mg/dL (ref 8.7–10.2)
Chloride: 102 mmol/L (ref 96–106)
Creatinine, Ser: 0.98 mg/dL (ref 0.76–1.27)
Globulin, Total: 2.7 g/dL (ref 1.5–4.5)
Glucose: 73 mg/dL (ref 70–99)
Potassium: 3.9 mmol/L (ref 3.5–5.2)
Sodium: 140 mmol/L (ref 134–144)
Total Protein: 7.1 g/dL (ref 6.0–8.5)
eGFR: 104 mL/min/1.73 (ref >59)

## 2024-02-13 LAB — LIPID PANEL
Chol/HDL Ratio: 3.9 ratio (ref 0.0–5.0)
Cholesterol, Total: 149 mg/dL (ref 100–199)
HDL: 38 mg/dL — ABNORMAL LOW (ref >39)
LDL Chol Calc (NIH): 97 mg/dL (ref 0–99)
Triglycerides: 72 mg/dL (ref 0–149)
VLDL Cholesterol Cal: 14 mg/dL (ref 5–40)

## 2024-02-13 LAB — HEMOGLOBIN A1C
Est. average glucose Bld gHb Est-mCnc: 94 mg/dL
Hgb A1c MFr Bld: 4.9 % (ref 4.8–5.6)

## 2024-02-19 ENCOUNTER — Ambulatory Visit: Payer: Self-pay | Admitting: Family Medicine

## 2025-02-12 ENCOUNTER — Encounter: Admitting: Family Medicine
